# Patient Record
Sex: Male | Born: 1975 | Race: White | Hispanic: No | Marital: Married | Smoking: Never smoker
Health system: Southern US, Community
[De-identification: ages and names within clinical notes are randomized; demographics above are authoritative.]

## PROBLEM LIST (undated history)

## (undated) DIAGNOSIS — S0990XA Unspecified injury of head, initial encounter: Secondary | ICD-10-CM

## (undated) HISTORY — PX: SHOULDER SURGERY: SHX246

## (undated) HISTORY — PX: TONSILLECTOMY: SUR1361

---

## 2001-08-13 ENCOUNTER — Emergency Department (HOSPITAL_COMMUNITY): Admission: EM | Admit: 2001-08-13 | Discharge: 2001-08-13 | Payer: Self-pay | Admitting: *Deleted

## 2011-07-03 ENCOUNTER — Emergency Department (HOSPITAL_COMMUNITY): Payer: Self-pay

## 2011-07-03 ENCOUNTER — Encounter: Payer: Self-pay | Admitting: Emergency Medicine

## 2011-07-03 ENCOUNTER — Emergency Department (HOSPITAL_COMMUNITY)
Admission: EM | Admit: 2011-07-03 | Discharge: 2011-07-03 | Disposition: A | Discharge status: Home / Self Care | Payer: Self-pay | Attending: Emergency Medicine | Admitting: Emergency Medicine

## 2011-07-03 DIAGNOSIS — Z888 Allergy status to other drugs, medicaments and biological substances status: Secondary | ICD-10-CM | POA: Insufficient documentation

## 2011-07-03 DIAGNOSIS — S022XXA Fracture of nasal bones, initial encounter for closed fracture: Secondary | ICD-10-CM | POA: Insufficient documentation

## 2011-07-03 DIAGNOSIS — Z880 Allergy status to penicillin: Secondary | ICD-10-CM | POA: Insufficient documentation

## 2011-07-03 DIAGNOSIS — IMO0002 Reserved for concepts with insufficient information to code with codable children: Secondary | ICD-10-CM | POA: Insufficient documentation

## 2011-07-03 DIAGNOSIS — F17200 Nicotine dependence, unspecified, uncomplicated: Secondary | ICD-10-CM | POA: Insufficient documentation

## 2011-07-03 DIAGNOSIS — Y92009 Unspecified place in unspecified non-institutional (private) residence as the place of occurrence of the external cause: Secondary | ICD-10-CM | POA: Insufficient documentation

## 2011-07-03 HISTORY — DX: Unspecified injury of head, initial encounter: S09.90XA

## 2011-07-03 MED ORDER — PROMETHAZINE HCL 25 MG PO TABS: 25.0000 mg | ORAL_TABLET | Freq: Four times a day (QID) | ORAL | Status: AC | PRN

## 2011-07-03 MED ORDER — SODIUM CHLORIDE 0.9 % IV SOLN
INTRAVENOUS | Status: DC
  Administered 2011-07-03: 20:00:00 via INTRAVENOUS

## 2011-07-03 MED ORDER — HYDROCODONE-ACETAMINOPHEN 5-325 MG PO TABS: 2.0000 | ORAL_TABLET | Freq: Four times a day (QID) | ORAL | Status: AC | PRN

## 2011-07-03 MED ORDER — KETOROLAC TROMETHAMINE 30 MG/ML IJ SOLN
30.0000 mg | Freq: Once | INTRAMUSCULAR | Status: AC
  Administered 2011-07-03: 30 mg via INTRAVENOUS
  Filled 2011-07-03: qty 1

## 2011-07-03 MED ORDER — METOCLOPRAMIDE HCL 5 MG/ML IJ SOLN
10.0000 mg | Freq: Once | INTRAMUSCULAR | Status: AC
  Administered 2011-07-03: 10 mg via INTRAVENOUS
  Filled 2011-07-03: qty 2
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
## 2012-12-30 IMAGING — CT CT MAXILLOFACIAL W/O CM
3 of 4 series · 16 of 47 positions shown, 19 images · non-contrast
Comparison: None.

CT HEAD

CLINICAL DATA: Facial trauma, epistaxis, headache

CT HEAD WITHOUT CONTRAST
CT MAXILLOFACIAL WITHOUT CONTRAST
TECHNIQUE: Multidetector CT imaging of the head and maxillofacial
structures were performed using the standard protocol without
intravenous contrast. Multiplanar CT image reconstructions of the
maxillofacial structures were also generated.

[Series 4: max st 2.0 h31s · axial · 0.34mm/px · z∈[+67,+215]mm · 10 of 84 slices shown, 13 images]
[im 5/84  brain]
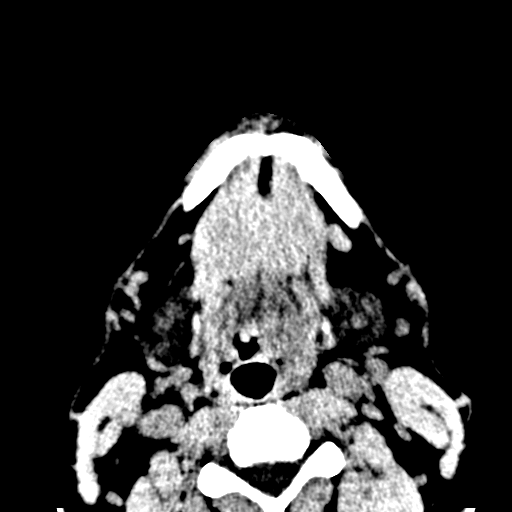
[im 5/84  bone]
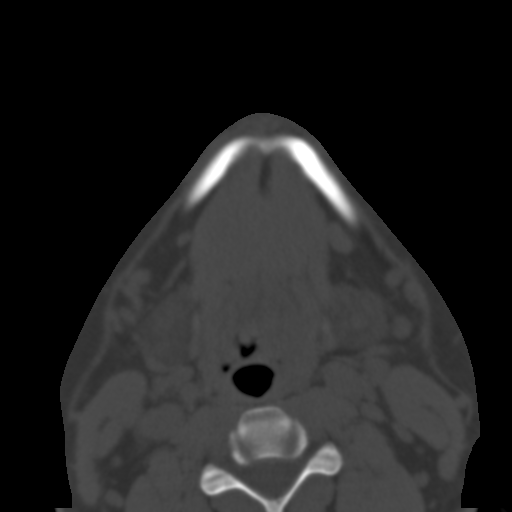
[im 13/84  bone]
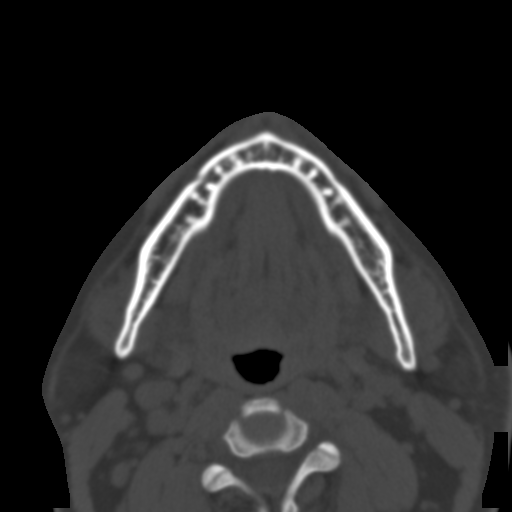
[im 21/84  bone]
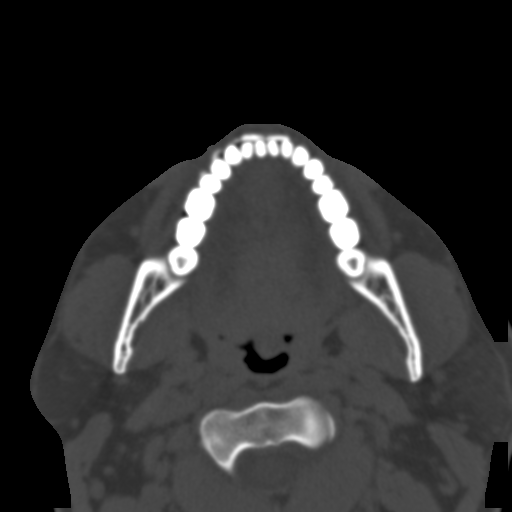
[im 30/84  bone]
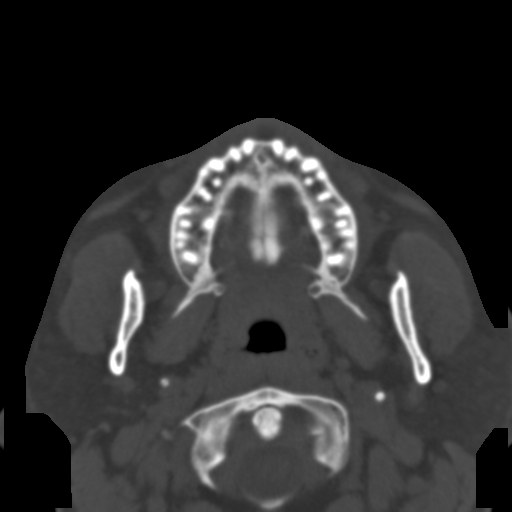
[im 38/84  brain]
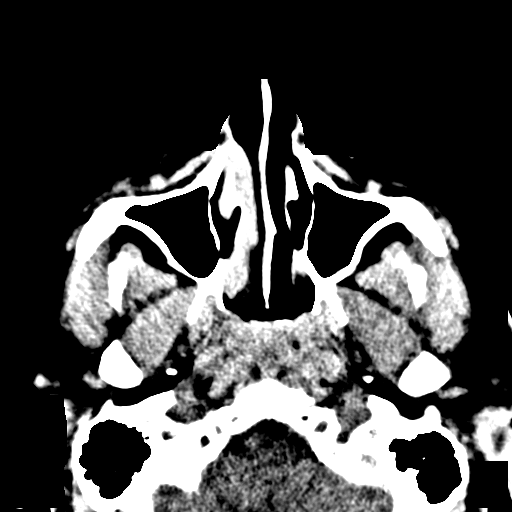
[im 38/84  bone]
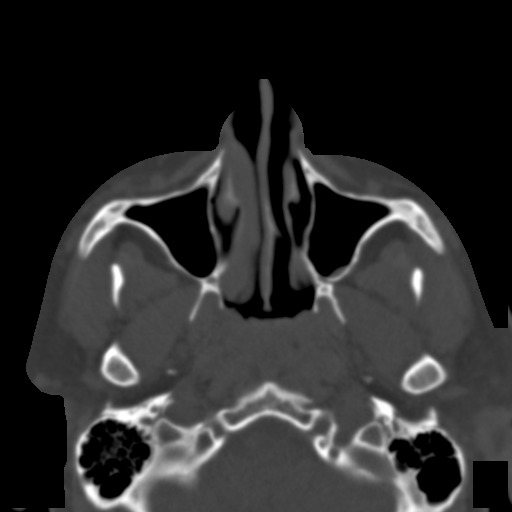
[im 46/84  bone]
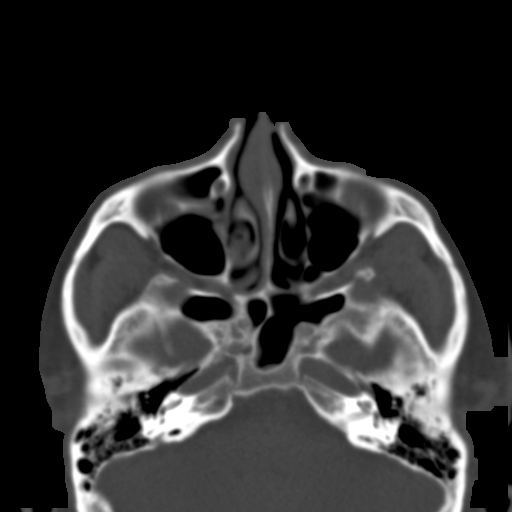
[im 54/84  bone]
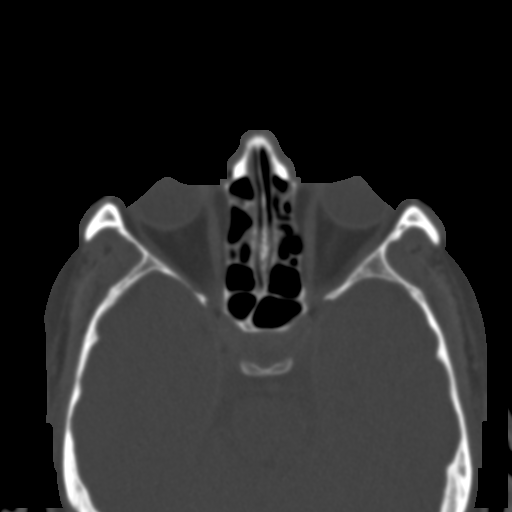
[im 63/84  bone]
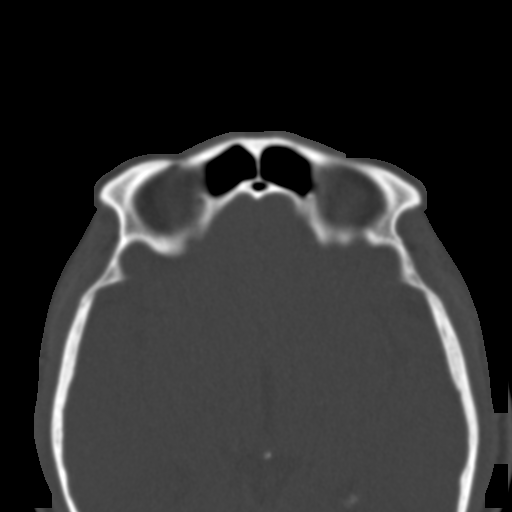
[im 71/84  brain]
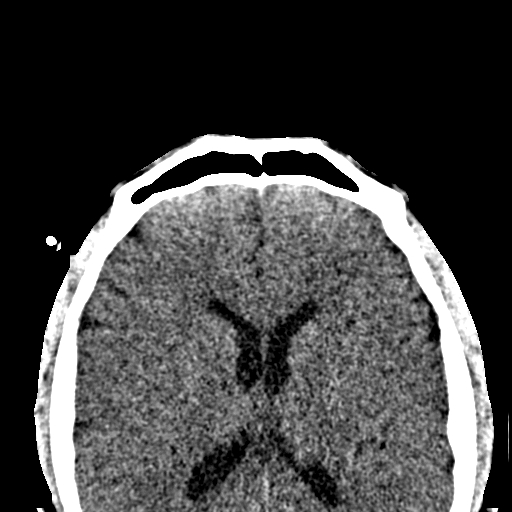
[im 71/84  bone]
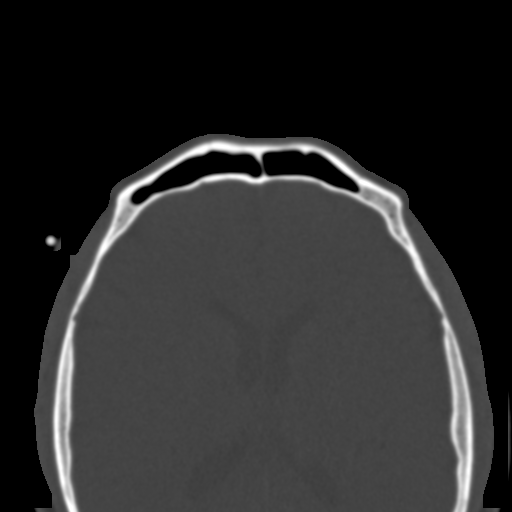
[im 79/84  bone]
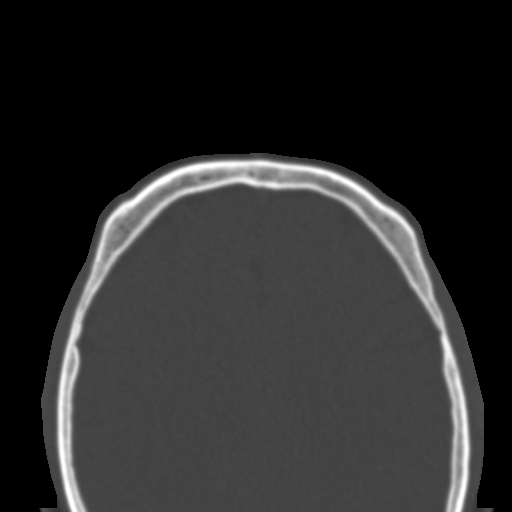

[Series 6: max st coronal · coronal · 0.31mm/px · 3 of 73 slices shown]
[im 25/73  bone]
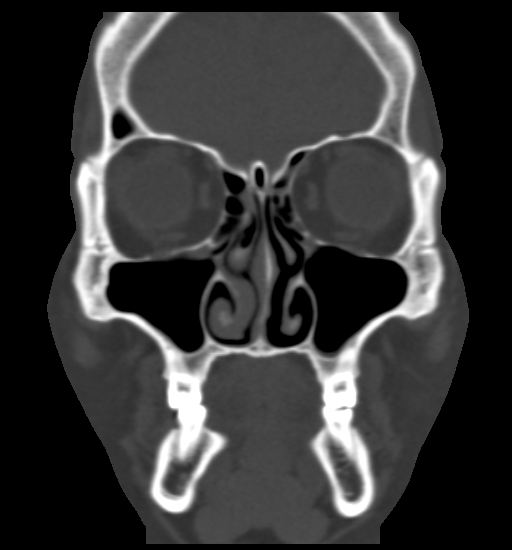
[im 33/73  bone]
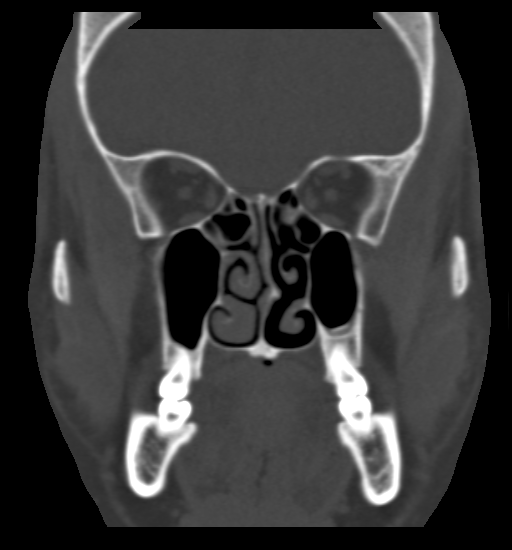
[im 41/73  bone]
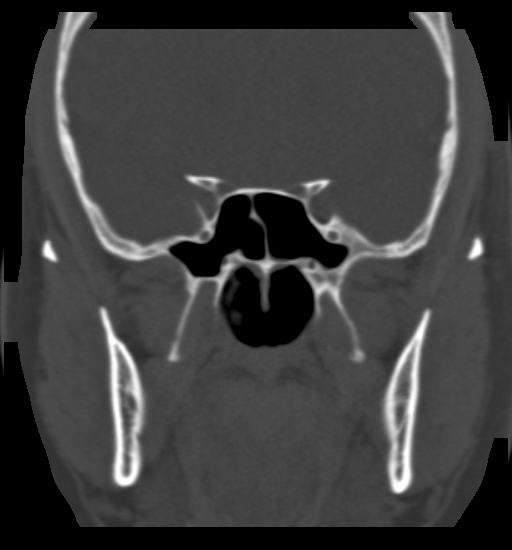

[Series 7: max st sag · sagittal · 0.29mm/px · 3 of 81 slices shown]
[im 27/81  bone]
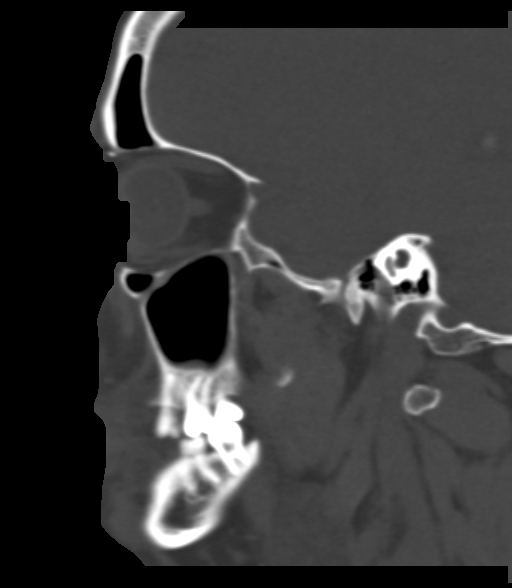
[im 41/81  bone]
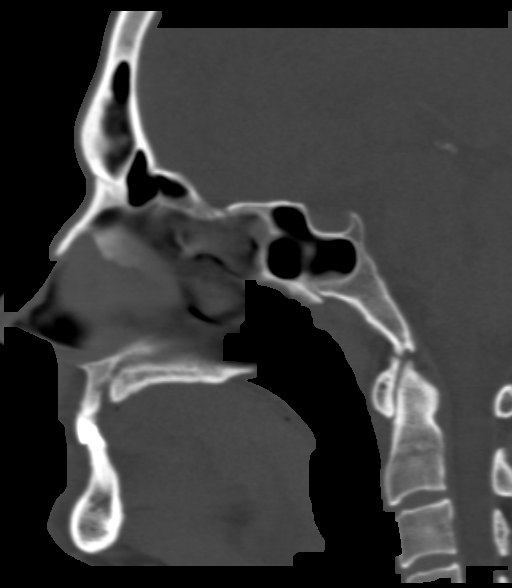
[im 54/81  bone]
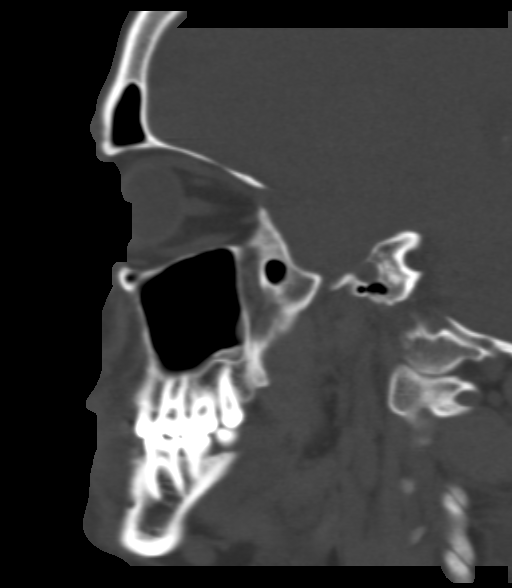

[16 of 47 positions shown; findings below may reference images not displayed]

FINDINGS: No acute hemorrhage, acute infarction, or mass lesion is
identified.  No midline shift.  No ventriculomegaly.  No skull
fracture.  Bone detail will be described in dedicated report below.
IMPRESSION: No acute intracranial finding.  Ethmoid sinusitis.

CT MAXILLOFACIAL
FINDINGS: Mandibular condyles are properly located.  Ethmoid and
maxillary mucoperiosteal thickening noted.  Nondisplaced nasal bone
fracture noted with overlying soft tissue swelling.  The vomer is
midline.  Orbits are unremarkable.  Zygomatic arches are
unremarkable.
IMPRESSION: Nondisplaced nasal bone fracture.

Ethmoid and maxillary sinusitis.

## 2018-07-22 ENCOUNTER — Ambulatory Visit: Payer: Managed Care, Other (non HMO) | Admitting: Sports Medicine

## 2018-07-22 ENCOUNTER — Ambulatory Visit: Payer: Managed Care, Other (non HMO) | Admitting: Podiatry

## 2018-07-22 ENCOUNTER — Encounter: Payer: Self-pay | Admitting: Sports Medicine

## 2018-07-22 ENCOUNTER — Ambulatory Visit (INDEPENDENT_AMBULATORY_CARE_PROVIDER_SITE_OTHER): Payer: Managed Care, Other (non HMO)

## 2018-07-22 DIAGNOSIS — M79672 Pain in left foot: Secondary | ICD-10-CM

## 2018-07-22 DIAGNOSIS — M722 Plantar fascial fibromatosis: Secondary | ICD-10-CM

## 2018-07-22 DIAGNOSIS — M7662 Achilles tendinitis, left leg: Secondary | ICD-10-CM

## 2018-07-22 MED ORDER — TRIAMCINOLONE ACETONIDE 10 MG/ML IJ SUSP: 10.0000 mg | Freq: Once | INTRAMUSCULAR | Status: AC

## 2018-07-22 MED ORDER — METHYLPREDNISOLONE 4 MG PO TBPK: ORAL_TABLET | ORAL | 0 refills | Status: AC

## 2018-07-22 NOTE — Progress Notes (Signed)
Subjective: Donald HumphreyBilly Clarke is a 42 y.o. male patient presents to office with complaint of moderate heel pain on the left plantar greater than posterior heel.  Patient admits to post static dyskinesia for 2 months and reports that this could have started after they started increasing his hours at work and he started doing a lot of overtime.  Patient states that pain is 6 out of 10 throbbing in nature however it slowly progresses throughout the day and by the end of the day the pain is 9 out of 10.  Patient states that the pain is in the arch and also the heel and feels like is radiating into the foot or back ankle.  Patient reports that he works for [employer] and does a lot of walking and standing and states that he gets sharp pain in his feet has tried Epson salt soaking and icing with no relief. Denies any other pedal complaints.   Patient reports that his doctor wrote him out of work until 07/24/2018 due to the severe foot pain and currently is taking tramadol.  There are no active problems to display for this patient.   Current Outpatient Medications on File Prior to Visit  Medication Sig Dispense Refill  . traMADol (ULTRAM) 50 MG tablet Take by mouth every 6 (six) hours as needed.     No current facility-administered medications on file prior to visit.     Allergies  Allergen Reactions  . Aspirin   . Codeine   . Penicillins     Objective: Physical Exam General: The patient is alert and oriented x3 in no acute distress.  Dermatology: Skin is warm, dry and supple bilateral lower extremities. Nails 1-10 are normal. There is no erythema, edema, no eccymosis, no open lesions present.  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  Integument is otherwise unremarkable.  Vascular: Dorsalis Pedis pulse and Posterior Tibial pulse are 2/4 bilateral. Capillary fill time is immediate to all digits.  Neurological:  Grossly intact to light touch with an achilles reflex of +2/5 and a  negative Tinel's sign bilateral.  Musculoskeletal: Tenderness to palpation at the medial calcaneal tubercale and through the insertion of the plantar fascia on the left foot.  There is pain with palpation to the insertion of the Achilles tendon diffusely on the left with subjective tightness, no pain with compression of calcaneus bilateral. No pain with tuning fork to calcaneus bilateral. No pain with calf compression bilateral. There is decreased Ankle joint range of motion bilateral. All other joints range of motion within normal limits bilateral. Strength 5/5 in all groups bilateral.   Gait: Unassisted, Antalgic avoid weight on left heel  Xray, Left foot:  Normal osseous mineralization. Joint spaces preserved. No fracture/dislocation/boney destruction. Calcaneal spur present with mild thickening of plantar fascia. No other soft tissue abnormalities or radiopaque foreign bodies.   Assessment and Plan: Problem List Items Addressed This Visit    None    Visit Diagnoses    Plantar fasciitis of left foot    -  Primary   Relevant Medications   triamcinolone acetonide (KENALOG) 10 MG/ML injection 10 mg   methylPREDNISolone (MEDROL DOSEPAK) 4 MG TBPK tablet   Other Relevant Orders   DG Foot Complete Left (Completed)   Tendonitis, Achilles, left       Relevant Medications   triamcinolone acetonide (KENALOG) 10 MG/ML injection 10 mg   methylPREDNISolone (MEDROL DOSEPAK)  4 MG TBPK tablet   Pain of left heel          -Complete examination performed.  -Xrays reviewed -Discussed with patient in detail the condition of plantar fasciitis with Achilles tendinitis, how this occurs and general treatment options. Explained both conservative and surgical treatments.  -After oral consent and aseptic prep, injected a mixture containing 1 ml of 2%  plain lidocaine, 1 ml 0.5% plain marcaine, 0.5 ml of kenalog 10 and 0.5 ml of  dexamethasone phosphate into left heel at plantar fascial insertion. Post-injection care discussed with patient.  -Rx Medrol dose pack and patient to continue with tramadol as given by PCP -Recommended good supportive shoes and advised use of OTC insert.  Dispensed heel cushion and advised patient to continue with no work if fails to improve patient may benefit from a cam boot. -Explained and dispensed to patient daily stretching exercises. -Recommend patient to ice affected area 1-2x daily. -Patient to return to office in 2 weeks for follow up or sooner if problems or questions arise.  Asencion Islam, DPM

## 2018-07-22 NOTE — Patient Instructions (Signed)

## 2018-07-30 ENCOUNTER — Ambulatory Visit: Payer: Managed Care, Other (non HMO) | Admitting: Sports Medicine

## 2018-08-06 ENCOUNTER — Telehealth: Payer: Self-pay | Admitting: *Deleted

## 2018-08-06 ENCOUNTER — Encounter: Payer: Self-pay | Admitting: Sports Medicine

## 2018-08-06 ENCOUNTER — Ambulatory Visit: Payer: Managed Care, Other (non HMO) | Admitting: Sports Medicine

## 2018-08-06 DIAGNOSIS — M7662 Achilles tendinitis, left leg: Secondary | ICD-10-CM | POA: Diagnosis not present

## 2018-08-06 DIAGNOSIS — S96912A Strain of unspecified muscle and tendon at ankle and foot level, left foot, initial encounter: Secondary | ICD-10-CM | POA: Diagnosis not present

## 2018-08-06 DIAGNOSIS — M79672 Pain in left foot: Secondary | ICD-10-CM | POA: Diagnosis not present

## 2018-08-06 DIAGNOSIS — M722 Plantar fascial fibromatosis: Secondary | ICD-10-CM

## 2018-08-06 MED ORDER — MELOXICAM 15 MG PO TABS: 15.0000 mg | ORAL_TABLET | Freq: Every day | ORAL | 0 refills | Status: DC

## 2018-08-06 NOTE — Progress Notes (Signed)
Subjective: Donald Clarke is a 42 y.o. male patient returns office for follow-up evaluation of moderate to severe left heel pain states that the pain today is worse at the posterior side especially at the heel bone and it radiates to the plantar surface of the heel out into the arch states that the boot helps with walking however as soon as he takes the boot off there is severe throbbing through the back of the heel and arch with continued swelling states that he completed the steroid by mouth which helped to get some of the swelling down however the pain is still there states that he has been taking tramadol to also help of which his primary care doctor gave him as well and states that when he went to see his primary care doctor on last week she also gave him the cam boot however his pain has still not resolved and he has still been unable to return to work due to the fact that he cannot wear boot at work and due to the fact that he has still significant pain requiring pain medication.  Patient denies nausea, vomiting, fever, chills or any other constitutional symptoms at this time.  There are no active problems to display for this patient.   Current Outpatient Medications on File Prior to Visit  Medication Sig Dispense Refill  . methylPREDNISolone (MEDROL DOSEPAK) 4 MG TBPK tablet Take as directed 21 tablet 0  . naproxen (NAPROSYN) 500 MG tablet Take 500 mg by mouth 2 (two) times daily with a meal.  0  . predniSONE (DELTASONE) 50 MG tablet     . traMADol (ULTRAM) 50 MG tablet Take by mouth every 6 (six) hours as needed.     Current Facility-Administered Medications on File Prior to Visit  Medication Dose Route Frequency Provider Last Rate Last Dose  . triamcinolone acetonide (KENALOG) 10 MG/ML injection 10 mg  10 mg Other Once Asencion Islam, DPM        Allergies  Allergen Reactions  . Aspirin   . Codeine   . Penicillins     Objective: Physical Exam General: The patient is alert and  oriented x3 in no acute distress.  Dermatology: Skin is warm, dry and supple bilateral lower extremities. Nails 1-10 are normal. There is no erythema, edema, no eccymosis, no open lesions present.  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  Integument is otherwise unremarkable.  Vascular: Dorsalis Pedis pulse and Posterior Tibial pulse are 2/4 bilateral. Capillary fill time is immediate to all digits.  Neurological: Grossly intact to light touch with an achilles reflex of +2/5 and a  negative Tinel's sign bilateral.  Musculoskeletal: Moderate tenderness to palpation at the medial calcaneal tubercale and through the insertion of the plantar fascia on the left foot.  There is moderate pain with palpation to the insertion of the Achilles tendon diffusely on the left with subjective tightness and weakness, no pain with compression of calcaneus bilateral. No pain with tuning fork to calcaneus bilateral. No pain with calf compression bilateral. There is decreased Ankle joint range of motion bilateral. All other joints range of motion within normal limits bilateral. Strength 5/5 in all groups bilateral however there is guarding today due to pain and negative Thompson sign.  Gait: Cam boot assisted, Antalgic avoid weight on left heel.   Assessment and Plan: Problem List Items Addressed This Visit    None  Visit Diagnoses    Plantar fasciitis of left foot    -  Primary   Relevant Medications   meloxicam (MOBIC) 15 MG tablet   Other Relevant Orders   DME Crutches   Tendonitis, Achilles, left       Relevant Medications   meloxicam (MOBIC) 15 MG tablet   Other Relevant Orders   DME Crutches   Pain of left heel       Tear of tendon of left ankle, initial encounter          -Complete examination performed.  -Previous xrays reviewed -Discussed with patient in detail the condition of plantar fasciitis with  Achilles tendinitis with concern for possible partial tear since pain is worsening, how this occurs and general treatment options. Explained both conservative and surgical treatments.  -Dispensed crutches and applied a soft cast to the patient's left foot and advised nonweightbearing to see if this will assist with pain reduction -Rx meloxicam and patient to continue with tramadol as given by PCP -Recommended continue with no work at this time until MRI has been obtained to rule out partial tear -Recommend patient to ice affected area 1-2x daily. -Patient to return to office after MRI or sooner if problems or questions arise.  Asencion Islam, DPM

## 2018-08-06 NOTE — Telephone Encounter (Signed)
-----   Message from [place], [place] sent at 08/06/2018  2:55 PM EDT ----- Regarding: MRI Left heel Pain at achilles and plantar fascia thats severe and no improvement

## 2018-08-07 ENCOUNTER — Encounter: Payer: Self-pay | Admitting: Sports Medicine

## 2018-08-07 NOTE — Telephone Encounter (Signed)
Faxed orders, 08/06/2018 clinical, demographcs to [facility].

## 2018-08-09 ENCOUNTER — Telehealth: Payer: Self-pay | Admitting: Sports Medicine

## 2018-08-09 NOTE — Telephone Encounter (Signed)
Pt did not call at 09:12am. I called Crystal - [facility] and informed that our office had been informed [facility] performed the pre-cert on [employer]. Crystal states they do [employer] but need additional information. Faxed the previously faxed required form, LOV clinicals and 07/22/2018 clinicals to Crystal's fax.

## 2018-08-09 NOTE — Telephone Encounter (Signed)
Pt is scheduled for MRI of left ankle on Monday 10/7, [employer] is requiring a prior authorization. Please call  [phone] and speak with  Crystal @ [facility].

## 2018-08-12 ENCOUNTER — Other Ambulatory Visit: Payer: Self-pay | Admitting: Sports Medicine

## 2018-08-13 NOTE — Telephone Encounter (Signed)
Pt states he called [facility] and they stated they do not have the orders for his MRI. I told pt his MRI had been ordered at [facility] and I would call to check the status.

## 2018-08-13 NOTE — Telephone Encounter (Signed)
I spoke with Shawna Orleans - [facility] states their records showed he was a no show. I told Shawna Orleans he never received a call. I told Shawna Orleans I had received a call from Crystal - [facility] and she had requested more clinicals and I had faxed those on 08/09/2018, I asked to speak with Crystal. Crystal states MRI required a PEER TO PEER. I asked if the contacted the ordering doctor to inform of the PEER TO PEER and she stated she had. I did not receive another call from [facility] after the request for clinicals on 08/09/2018. I gave the MRI and Clinicals to Tomasa Hose, RN for follow up on pre-cert to include possible PEER TO PEER.

## 2018-08-13 NOTE — Telephone Encounter (Signed)
Left message informing pt our pre-cert coordinator would process and I would call again with instructions.

## 2018-08-15 ENCOUNTER — Telehealth: Payer: Self-pay | Admitting: Sports Medicine

## 2018-08-15 DIAGNOSIS — S96912A Strain of unspecified muscle and tendon at ankle and foot level, left foot, initial encounter: Secondary | ICD-10-CM

## 2018-08-15 DIAGNOSIS — M722 Plantar fascial fibromatosis: Secondary | ICD-10-CM

## 2018-08-15 DIAGNOSIS — M7662 Achilles tendinitis, left leg: Secondary | ICD-10-CM

## 2018-08-15 DIAGNOSIS — M79672 Pain in left foot: Secondary | ICD-10-CM

## 2018-08-15 NOTE — Addendum Note (Signed)
Addended by: Alphia Kava D on: 08/15/2018 03:43 PM   Modules accepted: Orders

## 2018-08-15 NOTE — Telephone Encounter (Signed)
This is Engineer, production from [employer]. I'm calling to say the MRI Dr. Marylene Land requested will be approved by the end of the business day so you can proceed with scheduling. You can call us with any questions at [phone].

## 2018-08-15 NOTE — Telephone Encounter (Signed)
Left message informing pt our office had received prior authorization for the MRI of left ankle and to schedule with [facility] [phone].

## 2018-08-19 ENCOUNTER — Telehealth: Payer: Self-pay | Admitting: Sports Medicine

## 2018-08-19 NOTE — Telephone Encounter (Signed)
This is Tia calling from [facility]. We need to know Mr. Dougher next appointment so we can extend his benefits. Our number is [phone].

## 2018-08-19 NOTE — Telephone Encounter (Signed)
Called and spoke with Lawanna Kobus at [facility]. Told him when the pt's next appointment was as pt is still out of work. Lawanna Kobus stated they would contact our office if they needed anything else.

## 2018-08-20 ENCOUNTER — Telehealth: Payer: Self-pay | Admitting: Sports Medicine

## 2018-08-20 DIAGNOSIS — S96912A Strain of unspecified muscle and tendon at ankle and foot level, left foot, initial encounter: Secondary | ICD-10-CM

## 2018-08-20 DIAGNOSIS — M7662 Achilles tendinitis, left leg: Secondary | ICD-10-CM

## 2018-08-20 DIAGNOSIS — M722 Plantar fascial fibromatosis: Secondary | ICD-10-CM

## 2018-08-20 DIAGNOSIS — M79672 Pain in left foot: Secondary | ICD-10-CM

## 2018-08-20 NOTE — Telephone Encounter (Signed)
Patient received a call from [name] and was told he needed to pay 400.00 for his MRI. He cant afford that right now, they also told him, he could pay 200 That's still out of reach right now. Mr. Donald Clarke just not sure what to do.

## 2018-08-22 NOTE — Telephone Encounter (Signed)
Since his suspected pain is along soft tissues like fascia and achilles we can order a MSK ultrasound since that will be less costly than MRI and still help me r/o tear -Dr. Marylene Land

## 2018-08-22 NOTE — Telephone Encounter (Signed)
Left message informing pt, that his orders for the MRI had been sent to [facility], but if he had not called [facility] but had called [facility] we could send orders to [facility] they may be less expensive, to call so I could discuss.

## 2018-08-22 NOTE — Telephone Encounter (Signed)
Pt states Stacy - [facility] states pt's MRI would need to be performed at [facility] and [facility] states he would owe $400.00. I told pt I would inform Dr. Marylene Land and for more information and call again. I transferred pt to [facility] [place] [employer] to discuss disablity.

## 2018-08-23 NOTE — Addendum Note (Signed)
Addended by: Alphia Kava D on: 08/23/2018 09:46 AM   Modules accepted: Orders

## 2018-08-23 NOTE — Telephone Encounter (Signed)
I informed pt of [facility] statement.

## 2018-08-23 NOTE — Telephone Encounter (Signed)
Pt called states [facility] states they do not do [place] on feet.

## 2018-08-23 NOTE — Telephone Encounter (Signed)
I left message on pt's mobile phone with [facility] [phone].

## 2018-08-23 NOTE — Telephone Encounter (Signed)
[facility] [employer] states they can do it but they have to have radiologist perform and will schedule with radiologist secretary and contact pt.

## 2018-08-23 NOTE — Telephone Encounter (Signed)
I informed pt of Dr. Wynema Birch [place] order and pt states he would like to have in Cash at [facility]. Orders faxed to [facility].

## 2018-08-26 ENCOUNTER — Ambulatory Visit
Admission: RE | Admit: 2018-08-26 | Discharge: 2018-08-26 | Disposition: A | Discharge status: Home / Self Care | Payer: Managed Care, Other (non HMO) | Source: Ambulatory Visit | Attending: Sports Medicine | Admitting: Sports Medicine

## 2018-08-26 DIAGNOSIS — S96912A Strain of unspecified muscle and tendon at ankle and foot level, left foot, initial encounter: Secondary | ICD-10-CM

## 2018-08-26 DIAGNOSIS — M722 Plantar fascial fibromatosis: Secondary | ICD-10-CM

## 2018-08-26 DIAGNOSIS — M79672 Pain in left foot: Secondary | ICD-10-CM

## 2018-08-26 DIAGNOSIS — M7662 Achilles tendinitis, left leg: Secondary | ICD-10-CM

## 2018-08-27 ENCOUNTER — Ambulatory Visit (INDEPENDENT_AMBULATORY_CARE_PROVIDER_SITE_OTHER): Payer: Managed Care, Other (non HMO) | Admitting: Sports Medicine

## 2018-08-27 ENCOUNTER — Encounter: Payer: Self-pay | Admitting: Sports Medicine

## 2018-08-27 DIAGNOSIS — M79672 Pain in left foot: Secondary | ICD-10-CM

## 2018-08-27 DIAGNOSIS — M7662 Achilles tendinitis, left leg: Secondary | ICD-10-CM

## 2018-08-27 DIAGNOSIS — M722 Plantar fascial fibromatosis: Secondary | ICD-10-CM

## 2018-08-27 NOTE — Progress Notes (Signed)
Subjective: Donald Clarke is a 42 y.o. male patient returns office for follow-up evaluation of moderate to severe left heel pain states that the pain today is a little better and that he has been taking tramadol to also help of which his primary care doctor gave him. Patient is here for [place] results.  Patient denies nausea, vomiting, fever, chills or any other constitutional symptoms at this time.  There are no active problems to display for this patient.   Current Outpatient Medications on File Prior to Visit  Medication Sig Dispense Refill  . meloxicam (MOBIC) 15 MG tablet Take 1 tablet (15 mg total) by mouth daily. 30 tablet 0  . methylPREDNISolone (MEDROL DOSEPAK) 4 MG TBPK tablet Take as directed 21 tablet 0  . naproxen (NAPROSYN) 500 MG tablet Take 500 mg by mouth 2 (two) times daily with a meal.  0  . predniSONE (DELTASONE) 50 MG tablet     . traMADol (ULTRAM) 50 MG tablet Take by mouth every 6 (six) hours as needed.     Current Facility-Administered Medications on File Prior to Visit  Medication Dose Route Frequency Provider Last Rate Last Dose  . triamcinolone acetonide (KENALOG) 10 MG/ML injection 10 mg  10 mg Other Once Asencion Islam, DPM        Allergies  Allergen Reactions  . Aspirin   . Codeine   . Penicillins     Objective: Physical Exam General: The patient is alert and oriented x3 in no acute distress.  Dermatology: Skin is warm, dry and supple bilateral lower extremities. Nails 1-10 are normal. There is no erythema, edema, no eccymosis, no open lesions present.  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  Integument is otherwise unremarkable.  Vascular: Dorsalis Pedis pulse and Posterior Tibial pulse are 2/4 bilateral. Capillary fill time is immediate to all digits.  Neurological: Grossly intact to light touch with an achilles reflex of +2/5 and a  negative Tinel's sign  bilateral.  Musculoskeletal: Moderate tenderness to palpation at the medial calcaneal tubercale and through the insertion of the plantar fascia on the left foot.  There is moderate pain with palpation to the insertion of the Achilles tendon diffusely on the left with subjective tightness and weakness, no pain with compression of calcaneus bilateral. No pain with tuning fork to calcaneus bilateral. No pain with calf compression bilateral. There is decreased Ankle joint range of motion bilateral. All other joints range of motion within normal limits bilateral. Strength 5/5 in all groups bilateral however there is guarding today due to pain and negative Thompson sign.  Gait: Cam boot assisted, Antalgic avoid weight on left heel.   Assessment and Plan: Problem List Items Addressed This Visit    None    Visit Diagnoses    Plantar fasciitis of left foot    -  Primary   Tendonitis, Achilles, left       Pain of left heel          -Complete examination performed.  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 Consent obtained for L EPF and Gastroc recession. Pre and Post op course explained. Risks, benefits, alternatives explained. No guarantees given or implied. Surgical booking slip submitted and provided patient with Surgical packet and info for [facility] -Patient has CAM Walker to use post op  with crutches for at least 1 month -No work until after surgery; expect 3 month post op recovery  -Patient to return to office after surgery or sooner if problems or questions arise.  Asencion Islam, DPM

## 2018-08-27 NOTE — Patient Instructions (Signed)
Pre-Operative Instructions  Congratulations, you have decided to take an important step towards improving your quality of life.  You can be assured that the doctors and staff at [facility] will be with you every step of the way.  Here are some important things you should know:  1. Plan to be at the surgery center/hospital at least 1 (one) hour prior to your scheduled time, unless otherwise directed by the surgical center/hospital staff.  You must have a responsible adult accompany you, remain during the surgery and drive you home.  Make sure you have directions to the surgical center/hospital to ensure you arrive on time. 2. If you are having surgery at [facility] or [facility] hospitals, you will need a copy of your medical history and physical form from your family physician within one month prior to the date of surgery. We will give you a form for your primary physician to complete.  3. We make every effort to accommodate the date you request for surgery.  However, there are times where surgery dates or times have to be moved.  We will contact you as soon as possible if a change in schedule is required.   4. No aspirin/ibuprofen for one week before surgery.  If you are on aspirin, any non-steroidal anti-inflammatory medications (Mobic, Aleve, Ibuprofen) should not be taken seven (7) days prior to your surgery.  You make take Tylenol for pain prior to surgery.  5. Medications - If you are taking daily heart and blood pressure medications, seizure, reflux, allergy, asthma, anxiety, pain or diabetes medications, make sure you notify the surgery center/hospital before the day of surgery so they can tell you which medications you should take or avoid the day of surgery. 6. No food or drink after midnight the night before surgery unless directed otherwise by surgical center/hospital staff. 7. No alcoholic beverages 24-hours prior to surgery.  No smoking 24-hours prior or 24-hours after  surgery. 8. Wear loose pants or shorts. They should be loose enough to fit over bandages, boots, and casts. 9. Don't wear slip-on shoes. Sneakers are preferred. 10. Bring your boot with you to the surgery center/hospital.  Also bring crutches or a walker if your physician has prescribed it for you.  If you do not have this equipment, it will be provided for you after surgery. 11. If you have not been contacted by the surgery center/hospital by the day before your surgery, call to confirm the date and time of your surgery. 12. Leave-time from work may vary depending on the type of surgery you have.  Appropriate arrangements should be made prior to surgery with your employer. 13. Prescriptions will be provided immediately following surgery by your doctor.  Fill these as soon as possible after surgery and take the medication as directed. Pain medications will not be refilled on weekends and must be approved by the doctor. 14. Remove nail polish on the operative foot and avoid getting pedicures prior to surgery. 15. Wash the night before surgery.  The night before surgery wash the foot and leg well with water and the antibacterial soap provided. Be sure to pay special attention to beneath the toenails and in between the toes.  Wash for at least three (3) minutes. Rinse thoroughly with water and dry well with a towel.  Perform this wash unless told not to do so by your physician.  Enclosed: 1 Ice pack (please put in freezer the night before surgery)   1 Hibiclens skin cleaner     Pre-op instructions  If you have any questions regarding the instructions, please do not hesitate to call our office.  [place]: [address], [place], [place] [address] -- [phone]  [place]: [address] [place], [place] [address] -- [phone]  [place]: [address]  [place],  [address] -- [phone]  [place]: [address], [address], [place],  [address] -- [phone]  Website:  [email or web address] 

## 2018-08-28 ENCOUNTER — Encounter: Payer: Self-pay | Admitting: Sports Medicine

## 2018-08-28 ENCOUNTER — Telehealth: Payer: Self-pay | Admitting: *Deleted

## 2018-08-28 ENCOUNTER — Other Ambulatory Visit: Payer: Self-pay | Admitting: Sports Medicine

## 2018-08-28 DIAGNOSIS — M7662 Achilles tendinitis, left leg: Secondary | ICD-10-CM

## 2018-08-28 DIAGNOSIS — M722 Plantar fascial fibromatosis: Secondary | ICD-10-CM

## 2018-08-28 DIAGNOSIS — M79672 Pain in left foot: Secondary | ICD-10-CM

## 2018-08-28 DIAGNOSIS — S96912A Strain of unspecified muscle and tendon at ankle and foot level, left foot, initial encounter: Secondary | ICD-10-CM

## 2018-08-28 NOTE — Telephone Encounter (Signed)
"  I'd like to schedule my surgery.  I saw Dr. Marylene Land yesterday."  Do you have a date in mind?  Dr. Marylene Land can do it on November 4.  "That date will be fine.  What time do I need to be there?"  Someone from the surgical center will call you the Friday before your surgery date and they will give you your arrival time.  You need to register with the surgical center online, the instructions are in the brochure that we gave you.  "Okay, I'll do that. Thank you."

## 2018-09-02 ENCOUNTER — Telehealth: Payer: Self-pay | Admitting: *Deleted

## 2018-09-02 NOTE — Telephone Encounter (Signed)
"  I am calling from [facility].  I was informed by the patient that he's scheduled to have surgery on November 4.  I need to confirm that this is true so I can initiate his request for leave."   Yes, he's scheduled for surgery on November 4.  (We had a poor connection and I could not obtain further details.)

## 2018-09-04 ENCOUNTER — Other Ambulatory Visit: Payer: Self-pay | Admitting: Sports Medicine

## 2018-09-04 DIAGNOSIS — M7662 Achilles tendinitis, left leg: Secondary | ICD-10-CM

## 2018-09-04 DIAGNOSIS — M722 Plantar fascial fibromatosis: Secondary | ICD-10-CM

## 2018-09-04 NOTE — Telephone Encounter (Signed)
Yes you may refill and make sure patient knows to stop taking Mobic at least 1 week prior to surgery Thanks Dr. Marylene Land

## 2018-09-09 ENCOUNTER — Encounter: Payer: Self-pay | Admitting: Sports Medicine

## 2018-09-09 ENCOUNTER — Other Ambulatory Visit: Payer: Self-pay | Admitting: Sports Medicine

## 2018-09-09 DIAGNOSIS — M7662 Achilles tendinitis, left leg: Secondary | ICD-10-CM

## 2018-09-09 DIAGNOSIS — M722 Plantar fascial fibromatosis: Secondary | ICD-10-CM

## 2018-09-09 MED ORDER — PROMETHAZINE HCL 25 MG PO TABS: 25.0000 mg | ORAL_TABLET | Freq: Three times a day (TID) | ORAL | 0 refills | Status: AC | PRN

## 2018-09-09 MED ORDER — DOCUSATE SODIUM 100 MG PO CAPS: 100.0000 mg | ORAL_CAPSULE | Freq: Two times a day (BID) | ORAL | 0 refills | Status: AC | PRN

## 2018-09-09 MED ORDER — HYDROMORPHONE HCL 4 MG PO TABS: 4.0000 mg | ORAL_TABLET | Freq: Four times a day (QID) | ORAL | 0 refills | Status: AC | PRN

## 2018-09-09 NOTE — Progress Notes (Signed)
Post op meds added to chart -Dr. Jaide Hillenburg 

## 2018-09-10 ENCOUNTER — Telehealth: Payer: Self-pay | Admitting: Sports Medicine

## 2018-09-10 NOTE — Telephone Encounter (Signed)
Post op check phone call made to patient. Patient reports that he is doing good. His leg is still numb states that he did not take pain medication last night. Reports he has no pain. Went to his son's football game last night and reports that he did not have any issues. He used handicap, crutches and iced and elevated during the game. Patient this AM denies any acute symptoms. I reminded him to continue with ice, elevation, rest, crutches and CAM boot to protect his foot. Patient expressed understanding and is to follow up as scheduled on next week for post op visit #1. -Dr. Marylene Land

## 2018-09-17 ENCOUNTER — Ambulatory Visit (INDEPENDENT_AMBULATORY_CARE_PROVIDER_SITE_OTHER): Payer: Managed Care, Other (non HMO) | Admitting: Sports Medicine

## 2018-09-17 ENCOUNTER — Encounter: Payer: Self-pay | Admitting: Sports Medicine

## 2018-09-17 ENCOUNTER — Telehealth: Payer: Self-pay | Admitting: *Deleted

## 2018-09-17 VITALS — BP 104/84 | HR 125

## 2018-09-17 DIAGNOSIS — Z9889 Other specified postprocedural states: Secondary | ICD-10-CM

## 2018-09-17 DIAGNOSIS — S96912A Strain of unspecified muscle and tendon at ankle and foot level, left foot, initial encounter: Secondary | ICD-10-CM

## 2018-09-17 DIAGNOSIS — M7662 Achilles tendinitis, left leg: Secondary | ICD-10-CM

## 2018-09-17 DIAGNOSIS — M722 Plantar fascial fibromatosis: Secondary | ICD-10-CM

## 2018-09-17 DIAGNOSIS — M79672 Pain in left foot: Secondary | ICD-10-CM

## 2018-09-17 MED ORDER — HYDROCODONE-ACETAMINOPHEN 10-325 MG PO TABS: 1.0000 | ORAL_TABLET | Freq: Three times a day (TID) | ORAL | 0 refills | Status: AC | PRN

## 2018-09-17 NOTE — Telephone Encounter (Signed)
Faxed order to [facility] and emailed to A. Trotter. 

## 2018-09-17 NOTE — Telephone Encounter (Signed)
-----   Message from Asencion Islamitorya Stover, [place] sent at 09/17/2018 11:35 AM EST ----- Regarding: Knee scooter [facility]

## 2018-09-17 NOTE — Progress Notes (Signed)
Subjective: Donald HumphreyBilly Danford is a 42 y.o. male patient seen today in office for POV #1  (DOS 09-09-18), S/P L EPF and gastrocnemius recession. Patient admits pain at surgical site 6-9/10 cramping sometimes in calf, denies headache, chest pain, shortness of breath, nausea, vomiting, fever, or chills. Patient states that he doesn't like the crutches. No other issues noted.   There are no active problems to display for this patient.   Current Outpatient Medications on File Prior to Visit  Medication Sig Dispense Refill  . docusate sodium (COLACE) 100 MG capsule Take 1 capsule (100 mg total) by mouth 2 (two) times daily as needed for mild constipation. 30 capsule 0  . meloxicam (MOBIC) 15 MG tablet TAKE 1 TABLET BY MOUTH EVERY DAY 30 tablet 0  . methylPREDNISolone (MEDROL DOSEPAK) 4 MG TBPK tablet Take as directed 21 tablet 0  . naproxen (NAPROSYN) 500 MG tablet Take 500 mg by mouth 2 (two) times daily with a meal.  0  . predniSONE (DELTASONE) 50 MG tablet     . promethazine (PHENERGAN) 25 MG tablet Take 1 tablet (25 mg total) by mouth every 8 (eight) hours as needed for nausea or vomiting. 30 tablet 0  . traMADol (ULTRAM) 50 MG tablet Take by mouth every 6 (six) hours as needed.     Current Facility-Administered Medications on File Prior to Visit  Medication Dose Route Frequency Provider Last Rate Last Dose  . triamcinolone acetonide (KENALOG) 10 MG/ML injection 10 mg  10 mg Other Once Asencion IslamStover, Joshu Furukawa, DPM        Allergies  Allergen Reactions  . Aspirin   . Codeine   . Penicillins     Objective: There were no vitals filed for this visit.  General: No acute distress, AAOx3  Left foot: Sutures intact with no gapping or dehiscence at surgical site, mild swelling to left surgical sites on foot and calf, no erythema, no warmth, no drainage, no signs of infection noted, Capillary fill time <3 seconds in all digits, gross sensation present via light touch to left foot. No pain or crepitation with  range of motion left foot however mild guarding to calf.  No pain with calf compression.   Assessment and Plan:  Problem List Items Addressed This Visit    None    Visit Diagnoses    Plantar fasciitis of left foot    -  Primary   Tendonitis, Achilles, left       Pain of left heel       Post-operative state       Relevant Medications   HYDROcodone-acetaminophen (NORCO) 10-325 MG tablet   Other Relevant Orders   DME Other see comment       -Patient seen and evaluated -Applied dry sterile dressing to surgical site left secured with ACE wrap -Continue with crutches and NWB and CAM boot -Changed Dilaudid to Norco  -Advised patient to limit activity to necessity  -Advised patient to ice and elevate as necessary  -Will plan for suture removal at next office visit. In the meantime, patient to call office if any issues or problems arise.   Asencion Islamitorya Zenola Dezarn, DPM

## 2018-09-24 ENCOUNTER — Encounter: Payer: Managed Care, Other (non HMO) | Admitting: Sports Medicine

## 2018-10-01 ENCOUNTER — Encounter: Payer: Self-pay | Admitting: Sports Medicine

## 2018-10-01 ENCOUNTER — Ambulatory Visit (INDEPENDENT_AMBULATORY_CARE_PROVIDER_SITE_OTHER): Payer: Managed Care, Other (non HMO) | Admitting: Sports Medicine

## 2018-10-01 DIAGNOSIS — S96912D Strain of unspecified muscle and tendon at ankle and foot level, left foot, subsequent encounter: Secondary | ICD-10-CM

## 2018-10-01 DIAGNOSIS — Z9889 Other specified postprocedural states: Secondary | ICD-10-CM

## 2018-10-01 DIAGNOSIS — M7662 Achilles tendinitis, left leg: Secondary | ICD-10-CM

## 2018-10-01 DIAGNOSIS — M722 Plantar fascial fibromatosis: Secondary | ICD-10-CM

## 2018-10-01 MED ORDER — HYDROCODONE-ACETAMINOPHEN 10-325 MG PO TABS: 1.0000 | ORAL_TABLET | Freq: Three times a day (TID) | ORAL | 0 refills | Status: AC | PRN

## 2018-10-01 NOTE — Progress Notes (Signed)
Subjective: Donald Clarke is a 42 y.o. male patient seen today in office for POV #2  (DOS 09-09-18), S/P L EPF and gastrocnemius recession. Patient admits pain at surgical site off and on sharp but doing better, Norco helps, denies headache, chest pain, shortness of breath, nausea, vomiting, fever, or chills. No other issues noted.   There are no active problems to display for this patient.   Current Outpatient Medications on File Prior to Visit  Medication Sig Dispense Refill  . docusate sodium (COLACE) 100 MG capsule Take 1 capsule (100 mg total) by mouth 2 (two) times daily as needed for mild constipation. 30 capsule 0  . meloxicam (MOBIC) 15 MG tablet TAKE 1 TABLET BY MOUTH EVERY DAY 30 tablet 0  . methylPREDNISolone (MEDROL DOSEPAK) 4 MG TBPK tablet Take as directed 21 tablet 0  . naproxen (NAPROSYN) 500 MG tablet Take 500 mg by mouth 2 (two) times daily with a meal.  0  . predniSONE (DELTASONE) 50 MG tablet     . promethazine (PHENERGAN) 25 MG tablet Take 1 tablet (25 mg total) by mouth every 8 (eight) hours as needed for nausea or vomiting. 30 tablet 0  . traMADol (ULTRAM) 50 MG tablet Take by mouth every 6 (six) hours as needed.     Current Facility-Administered Medications on File Prior to Visit  Medication Dose Route Frequency Provider Last Rate Last Dose  . triamcinolone acetonide (KENALOG) 10 MG/ML injection 10 mg  10 mg Other Once Asencion IslamStover, Latresha Yahr, DPM        Allergies  Allergen Reactions  . Aspirin   . Codeine   . Penicillins     Objective: There were no vitals filed for this visit.  General: No acute distress, AAOx3  Left foot: Sutures intact with no gapping or dehiscence at surgical site, mild swelling to left surgical sites on foot and calf, no erythema, no warmth, no drainage, no signs of infection noted, Capillary fill time <3 seconds in all digits, gross sensation present via light touch to left foot. No pain or crepitation with range of motion left foot however mild  guarding to calf.  No pain with calf compression.   Assessment and Plan:  Problem List Items Addressed This Visit    None    Visit Diagnoses    Plantar fasciitis of left foot    -  Primary   Post-operative state       Tear of tendon of left ankle, subsequent encounter       Tendonitis, Achilles, left           -Patient seen and evaluated -Sutures removed and steristrips applied; continue with ACE wrap and may shower with shower chair  -Continue with crutches and NWB and CAM boot; reordered knee scooter  -Refilled Norco   -Advised patient to limit activity to necessity  -Advised patient to ice and elevate as necessary  -Estimated return to work 12-10-18  -Will plan for xrays and starting passive PT at next office visit. In the meantime, patient to call office if any issues or problems arise.   Asencion Islamitorya Dabney Schanz, DPM

## 2018-10-02 ENCOUNTER — Telehealth: Payer: Self-pay | Admitting: *Deleted

## 2018-10-02 NOTE — Telephone Encounter (Signed)
-----   Message from Asencion Islamitorya Stover, [place] sent at 10/01/2018  8:33 AM EST ----- Regarding: Knee Scooter Patient states that he hasn't got a call about knee scooter

## 2018-10-02 NOTE — Telephone Encounter (Signed)
Unable to leave a message on pt's phone with contact information for [facility], due to restrictions on pt's phone.

## 2018-10-02 NOTE — Telephone Encounter (Signed)
Left message informing Donald Clarke of [facility] [phone] for knee scooter.

## 2018-10-15 ENCOUNTER — Encounter: Payer: Managed Care, Other (non HMO) | Admitting: Sports Medicine

## 2018-10-21 NOTE — Progress Notes (Signed)
DOS 09/09/2018  Release Achilles (gastrocnemieus muscle) and plantar fascia left foot.

## 2018-10-22 ENCOUNTER — Telehealth: Payer: Self-pay | Admitting: *Deleted

## 2018-10-22 ENCOUNTER — Ambulatory Visit (INDEPENDENT_AMBULATORY_CARE_PROVIDER_SITE_OTHER): Payer: Managed Care, Other (non HMO) | Admitting: Sports Medicine

## 2018-10-22 ENCOUNTER — Encounter: Payer: Self-pay | Admitting: Sports Medicine

## 2018-10-22 DIAGNOSIS — S96912D Strain of unspecified muscle and tendon at ankle and foot level, left foot, subsequent encounter: Secondary | ICD-10-CM

## 2018-10-22 DIAGNOSIS — Z9889 Other specified postprocedural states: Secondary | ICD-10-CM

## 2018-10-22 DIAGNOSIS — M722 Plantar fascial fibromatosis: Secondary | ICD-10-CM

## 2018-10-22 DIAGNOSIS — M7662 Achilles tendinitis, left leg: Secondary | ICD-10-CM

## 2018-10-22 NOTE — Telephone Encounter (Signed)
Left message for pt to call with the location of PT he would like in [place].

## 2018-10-22 NOTE — Telephone Encounter (Signed)
-----   Message from [place] Stover, [place] sent at 10/22/2018  9:32 AM EST ----- Regarding: PT PT 2x per week for 4 weeks Strengthening, gait, stability, mobility, pain reduction s/p L foot surgery Patient may come out of CAM boot with PT

## 2018-10-22 NOTE — Progress Notes (Signed)
Subjective: Donald HumphreyBilly Yoak is a 42 y.o. male patient seen today in office for POV # 3 (DOS 09-09-18), S/P L EPF and gastrocnemius recession. Patient admits that pain is doing better, gets ocassional pain but much better than last visit, denies headache, chest pain, shortness of breath, nausea, vomiting, fever, or chills. No other issues noted.   There are no active problems to display for this patient.   Current Outpatient Medications on File Prior to Visit  Medication Sig Dispense Refill  . docusate sodium (COLACE) 100 MG capsule Take 1 capsule (100 mg total) by mouth 2 (two) times daily as needed for mild constipation. 30 capsule 0  . HYDROcodone-acetaminophen (NORCO) 10-325 MG tablet Take 1 tablet by mouth every 8 (eight) hours as needed. 20 tablet 0  . meloxicam (MOBIC) 15 MG tablet TAKE 1 TABLET BY MOUTH EVERY DAY 30 tablet 0  . methylPREDNISolone (MEDROL DOSEPAK) 4 MG TBPK tablet Take as directed 21 tablet 0  . naproxen (NAPROSYN) 500 MG tablet Take 500 mg by mouth 2 (two) times daily with a meal.  0  . predniSONE (DELTASONE) 50 MG tablet     . promethazine (PHENERGAN) 25 MG tablet Take 1 tablet (25 mg total) by mouth every 8 (eight) hours as needed for nausea or vomiting. 30 tablet 0  . traMADol (ULTRAM) 50 MG tablet Take by mouth every 6 (six) hours as needed.     Current Facility-Administered Medications on File Prior to Visit  Medication Dose Route Frequency Provider Last Rate Last Dose  . triamcinolone acetonide (KENALOG) 10 MG/ML injection 10 mg  10 mg Other Once Asencion IslamStover, Guillaume Weninger, DPM        Allergies  Allergen Reactions  . Aspirin   . Codeine   . Penicillins     Objective: There were no vitals filed for this visit.  General: No acute distress, AAOx3  Left foot: Sutures intact with no gapping or dehiscence at surgical site, mild swelling to left surgical sites on foot and calf, no erythema, no warmth, no drainage, no signs of infection noted, Capillary fill time <3 seconds  in all digits, gross sensation present via light touch to left foot. No pain or crepitation with range of motion left foot however mild guarding to calf.  No pain with calf compression.   Assessment and Plan:  Problem List Items Addressed This Visit    None    Visit Diagnoses    Plantar fasciitis of left foot    -  Primary   Tendonitis, Achilles, left       Tear of tendon of left ankle, subsequent encounter       Post-operative state           -Patient seen and evaluated -May start to weightbear with CAM boot -Rx PT may come out of boot for PT -Advised patient to limit activity to necessity  -Advised patient to ice and elevate as necessary  -Estimated return to work 12-10-18; Office staff to f/u [employer] paperwork -Will plan for increasing activities and transition to post op shoe at next office visit. In the meantime, patient to call office if any issues or problems arise.   Asencion Islamitorya Gayathri Futrell, DPM

## 2018-11-12 ENCOUNTER — Encounter: Payer: Self-pay | Admitting: Sports Medicine

## 2018-11-12 ENCOUNTER — Telehealth: Payer: Self-pay | Admitting: *Deleted

## 2018-11-12 ENCOUNTER — Ambulatory Visit (INDEPENDENT_AMBULATORY_CARE_PROVIDER_SITE_OTHER): Payer: Managed Care, Other (non HMO) | Admitting: Sports Medicine

## 2018-11-12 DIAGNOSIS — M722 Plantar fascial fibromatosis: Secondary | ICD-10-CM

## 2018-11-12 DIAGNOSIS — Z9889 Other specified postprocedural states: Secondary | ICD-10-CM

## 2018-11-12 DIAGNOSIS — S96912D Strain of unspecified muscle and tendon at ankle and foot level, left foot, subsequent encounter: Secondary | ICD-10-CM

## 2018-11-12 DIAGNOSIS — M7662 Achilles tendinitis, left leg: Secondary | ICD-10-CM

## 2018-11-12 NOTE — Telephone Encounter (Signed)
[facility]

## 2018-11-12 NOTE — Progress Notes (Signed)
  Subjective: Rizwan Mammone is a 43 y.o. male patient seen today in office for POV # 4 (DOS 09-09-18), S/P L EPF and gastrocnemius recession. Patient admits that pain is doing better that ocassional pain shooting at arch, denies headache, chest pain, shortness of breath, nausea, vomiting, fever, or chills. No other issues noted.   There are no active problems to display for this patient.   Current Outpatient Medications on File Prior to Visit  Medication Sig Dispense Refill  . docusate sodium (COLACE) 100 MG capsule Take 1 capsule (100 mg total) by mouth 2 (two) times daily as needed for mild constipation. 30 capsule 0  . HYDROcodone-acetaminophen (NORCO) 10-325 MG tablet Take 1 tablet by mouth every 8 (eight) hours as needed. 20 tablet 0  . meloxicam (MOBIC) 15 MG tablet TAKE 1 TABLET BY MOUTH EVERY DAY 30 tablet 0  . methylPREDNISolone (MEDROL DOSEPAK) 4 MG TBPK tablet Take as directed 21 tablet 0  . naproxen (NAPROSYN) 500 MG tablet Take 500 mg by mouth 2 (two) times daily with a meal.  0  . predniSONE (DELTASONE) 50 MG tablet     . promethazine (PHENERGAN) 25 MG tablet Take 1 tablet (25 mg total) by mouth every 8 (eight) hours as needed for nausea or vomiting. 30 tablet 0  . traMADol (ULTRAM) 50 MG tablet Take by mouth every 6 (six) hours as needed.     Current Facility-Administered Medications on File Prior to Visit  Medication Dose Route Frequency Provider Last Rate Last Dose  . triamcinolone acetonide (KENALOG) 10 MG/ML injection 10 mg  10 mg Other Once Asencion Islam, DPM        Allergies  Allergen Reactions  . Aspirin   . Codeine   . Penicillins     Objective: There were no vitals filed for this visit.  General: No acute distress, AAOx3  Left foot: Incisions well healed with no gapping or dehiscence at surgical site, mild swelling to left surgical sites on foot and calf, no erythema, no warmth, no drainage, no signs of infection noted, Capillary fill time <3 seconds in all  digits, gross sensation present via light touch to left foot. Subjective ocassional shooting pain. No pain or crepitation with range of motion left foot however mild guarding to calf like before.  No pain with calf compression.   Assessment and Plan:  Problem List Items Addressed This Visit    None    Visit Diagnoses    Plantar fasciitis of left foot    -  Primary   Tendonitis, Achilles, left       Tear of tendon of left ankle, subsequent encounter       Post-operative state           -Patient seen and evaluated -May start to weightbear with Post op shoe as dispensed this visit -Rx PT; Order resent, may come out of shoe for PT -Advised patient to limit activity to tolerance  -Advised patient to ice and elevate as necessary  -Estimated return to work 12-10-18;continue with [employer]  -Will plan for increasing activities and assessing RTW at next office visit. In the meantime, patient to call office if any issues or problems arise.   Asencion Islam, DPM

## 2018-11-12 NOTE — Telephone Encounter (Signed)
-----   Message from Asencion Islam, [place] sent at 11/12/2018  8:58 AM EST ----- Regarding: Call about PT Please make sure patient has PT set up.  Thanks Dr. Kathie Rhodes

## 2018-11-15 ENCOUNTER — Telehealth: Payer: Self-pay | Admitting: Sports Medicine

## 2018-11-15 NOTE — Telephone Encounter (Signed)
Donald Clarke with the [facility] in [place] needs to have Operative Report faxed to [phone].

## 2018-11-18 NOTE — Telephone Encounter (Signed)
Has been faxed.

## 2018-12-03 ENCOUNTER — Ambulatory Visit (INDEPENDENT_AMBULATORY_CARE_PROVIDER_SITE_OTHER): Payer: Managed Care, Other (non HMO) | Admitting: Sports Medicine

## 2018-12-03 ENCOUNTER — Encounter: Payer: Self-pay | Admitting: Sports Medicine

## 2018-12-03 DIAGNOSIS — M7662 Achilles tendinitis, left leg: Secondary | ICD-10-CM

## 2018-12-03 DIAGNOSIS — S96912D Strain of unspecified muscle and tendon at ankle and foot level, left foot, subsequent encounter: Secondary | ICD-10-CM

## 2018-12-03 DIAGNOSIS — Z9889 Other specified postprocedural states: Secondary | ICD-10-CM

## 2018-12-03 DIAGNOSIS — M79672 Pain in left foot: Secondary | ICD-10-CM

## 2018-12-03 DIAGNOSIS — M722 Plantar fascial fibromatosis: Secondary | ICD-10-CM

## 2018-12-03 NOTE — Progress Notes (Signed)
  Subjective: Donald Clarke is a 43 y.o. male patient seen today in office for POV # 5 (DOS 09-09-18), S/P L EPF and gastrocnemius recession. Patient admits that he has a "weird" sensation at heel but NO pain and reports that he can't afford PT and can do exercises at home. Patient denies headache, chest pain, shortness of breath, nausea, vomiting, fever, or chills. No other issues noted.   There are no active problems to display for this patient.   Current Outpatient Medications on File Prior to Visit  Medication Sig Dispense Refill  . docusate sodium (COLACE) 100 MG capsule Take 1 capsule (100 mg total) by mouth 2 (two) times daily as needed for mild constipation. 30 capsule 0  . HYDROcodone-acetaminophen (NORCO) 10-325 MG tablet Take 1 tablet by mouth every 8 (eight) hours as needed. 20 tablet 0  . meloxicam (MOBIC) 15 MG tablet TAKE 1 TABLET BY MOUTH EVERY DAY 30 tablet 0  . methylPREDNISolone (MEDROL DOSEPAK) 4 MG TBPK tablet Take as directed 21 tablet 0  . naproxen (NAPROSYN) 500 MG tablet Take 500 mg by mouth 2 (two) times daily with a meal.  0  . predniSONE (DELTASONE) 50 MG tablet     . promethazine (PHENERGAN) 25 MG tablet Take 1 tablet (25 mg total) by mouth every 8 (eight) hours as needed for nausea or vomiting. 30 tablet 0  . traMADol (ULTRAM) 50 MG tablet Take by mouth every 6 (six) hours as needed.     Current Facility-Administered Medications on File Prior to Visit  Medication Dose Route Frequency Provider Last Rate Last Dose  . triamcinolone acetonide (KENALOG) 10 MG/ML injection 10 mg  10 mg Other Once Asencion Islam, DPM        Allergies  Allergen Reactions  . Aspirin   . Codeine   . Penicillins     Objective: There were no vitals filed for this visit.  General: No acute distress, AAOx3  Left foot: Incisions well healed, no erythema, no warmth, no drainage, no signs of infection noted, Capillary fill time <3 seconds in all digits, gross sensation present via light  touch to left foot. Subjective ocassional numbness to incisions sites. No pain or crepitation with range of motion left foot.  No pain with calf compression.   Assessment and Plan:  Problem List Items Addressed This Visit    None    Visit Diagnoses    Plantar fasciitis of left foot    -  Primary   Tendonitis, Achilles, left       Tear of tendon of left ankle, subsequent encounter       Post-operative state       Pain of left heel           -Patient seen and evaluated -May d/c PT since doing well  -Advised patient to continue with normal shoes  -Estimated return to work 12-10-18 with no limitations  -Will plan for final POV after he has returned back to work at next office visit. In the meantime, patient to call office if any issues or problems arise.   Asencion Islam, DPM

## 2018-12-03 NOTE — Patient Instructions (Signed)
Plantar Fasciitis Rehab Ask your health care provider which exercises are safe for you. Do exercises exactly as told by your health care provider and adjust them as directed. It is normal to feel mild stretching, pulling, tightness, or discomfort as you do these exercises, but you should stop right away if you feel sudden pain or your pain gets worse. Do not begin these exercises until told by your health care provider. Stretching and range of motion exercises These exercises warm up your muscles and joints and improve the movement and flexibility of your foot. These exercises also help to relieve pain. Exercise A: Plantar fascia stretch  1. Sit with your left / right leg crossed over your opposite knee. 2. Hold your heel with one hand with that thumb near your arch. With your other hand, hold your toes and gently pull them back toward the top of your foot. You should feel a stretch on the bottom of your toes or your foot or both. 3. Hold this stretch for__________ seconds. 4. Slowly release your toes and return to the starting position. Repeat __________ times. Complete this exercise __________ times a day. Exercise B: Gastroc, standing  1. Stand with your hands against a wall. 2. Extend your left / right leg behind you, and bend your front knee slightly. 3. Keeping your heels on the floor and keeping your back knee straight, shift your weight toward the wall without arching your back. You should feel a gentle stretch in your left / right calf. 4. Hold this position for __________ seconds. Repeat __________ times. Complete this exercise __________ times a day. Exercise C: Soleus, standing 1. Stand with your hands against a wall. 2. Extend your left / right leg behind you, and bend your front knee slightly. 3. Keeping your heels on the floor, bend your back knee and slightly shift your weight over the back leg. You should feel a gentle stretch deep in your calf. 4. Hold this position for  __________ seconds. Repeat __________ times. Complete this exercise __________ times a day. Exercise D: Gastrocsoleus, standing 1. Stand with the ball of your left / right foot on a step. The ball of your foot is on the walking surface, right under your toes. 2. Keep your other foot firmly on the same step. 3. Hold onto the wall or a railing for balance. 4. Slowly lift your other foot, allowing your body weight to press your heel down over the edge of the step. You should feel a stretch in your left / right calf. 5. Hold this position for __________ seconds. 6. Return both feet to the step. 7. Repeat this exercise with a slight bend in your left / right knee. Repeat __________ times with your left / right knee straight and __________ times with your left / right knee bent. Complete this exercise __________ times a day. Balance exercise This exercise builds your balance and strength control of your arch to help take pressure off your plantar fascia. Exercise E: Single leg stand 1. Without shoes, stand near a railing or in a doorway. You may hold onto the railing or door frame as needed. 2. Stand on your left / right foot. Keep your big toe down on the floor and try to keep your arch lifted. Do not let your foot roll inward. 3. Hold this position for __________ seconds. 4. If this exercise is too easy, you can try it with your eyes closed or while standing on a pillow. Repeat __________ times. Complete  this exercise __________ times a day. This information is not intended to replace advice given to you by your health care provider. Make sure you discuss any questions you have with your health care provider. Document Released: 10/23/2005 Document Revised: 06/27/2016 Document Reviewed: 09/06/2015 [employer] Interactive Patient Education  2019 [employer] Achilles Tendinitis Rehab Ask your health care provider which exercises are safe for you. Do exercises exactly as told by your health care  provider and adjust them as directed. It is normal to feel mild stretching, pulling, tightness, or discomfort as you do these exercises, but you should stop right away if you feel sudden pain or your pain gets worse. Do not begin these exercises until told by your health care provider. Stretching and range of motion exercises These exercises warm up your muscles and joints and improve the movement and flexibility of your ankle. These exercises also help to relieve pain, numbness, and tingling. Exercise A: Standing wall calf stretch, knee straight  1. Stand with your hands against a wall. 2. Extend your __________ leg behind you and bend your front knee slightly. Keep both of your heels on the floor. 3. Point the toes of your back foot slightly inward. 4. Keeping your heels on the floor and your back knee straight, shift your weight toward the wall. Do not allow your back to arch. You should feel a gentle stretch in your calf. 5. Hold this position for seconds. Repeat __________ times. Complete this stretch __________ times per day. Exercise B: Standing wall calf stretch, knee bent 1. Stand with your hands against a wall. 2. Extend your __________ leg behind you, and bend your front knee slightly. Keep both of your heels on the floor. 3. Point the toes of your back foot slightly inward. 4. Keeping your heels on the floor, unlock your back knee so that it is bent. You should feel a gentle stretch deep in your calf. 5. Hold this position for __________ seconds. Repeat __________ times. Complete this stretch __________ times per day. Strengthening exercises These exercises build strength and control of your ankle. Endurance is the ability to use your muscles for a long time, even after they get tired. Exercise C: Plantar flexion with band  1. Sit on the floor with your __________ leg extended. You may put a pillow under your calf to give your foot more room to move. 2. Loop a rubber exercise band  or tube around the ball of your __________ foot. The ball of your foot is on the walking surface, right under your toes. The band or tube should be slightly tense when your foot is relaxed. If the band or tube slips, you can put on your shoe or put a washcloth between the band and your foot to help it stay in place. 3. Slowly point your toes downward, pushing them away from you. 4. Hold this position for __________ seconds. 5. Slowly release the tension in the band or tube, controlling smoothly until your foot is back to the starting position. Repeat __________ times. Complete this exercise __________ times per day. Exercise D: Heel raise with eccentric lower  1. Stand on a step with the balls of your feet. The ball of your foot is on the walking surface, right under your toes. ? Do not put your heels on the step. ? For balance, rest your hands on the wall or on a railing. 2. Rise up onto the balls of your feet. 3. Keeping your heels up, shift all of  your weight to your __________ leg and pick up your other leg. 4. Slowly lower your __________ leg so your heel drops below the level of the step. 5. Put down your foot. If told by your health care provider, build up to:  3 sets of 15 repetitions while keeping your knees straight.  3 sets of 15 repetitions while keeping your knees bent as far as told by your health care provider. Complete this exercise __________ times per day. If this exercise is too easy, try doing it while wearing a backpack with weights in it. Balance exercises These exercises improve or maintain your balance. Balance is important in preventing falls. Exercise E: Single leg stand 1. Without shoes, stand near a railing or in a door frame. Hold on to the railing or door frame as needed. 2. Stand on your __________ foot. Keep your big toe down on the floor and try to keep your arch lifted. 3. Hold this position for __________ seconds. Repeat __________ times. Complete this  exercise __________ times per day. If this exercise is too easy, you can try it with your eyes closed or while standing on a pillow. This information is not intended to replace advice given to you by your health care provider. Make sure you discuss any questions you have with your health care provider. Document Released: 05/24/2005 Document Revised: 04/09/2017 Document Reviewed: 06/29/2015 Elsevier Interactive Patient Education  2019 [employer].

## 2019-01-04 IMAGING — US US EXTREM LOW*L* COMPLETE
1 series · 14 of 23 positions shown · non-contrast
Comparison: Radiographs dated 07/22/2018

CLINICAL DATA: Plantar left foot pain.  Posterior left heel pain.

EXAM:
LEFT LOWER EXTREMITY SOFT TISSUE ULTRASOUND COMPLETE
TECHNIQUE: Ultrasound examination was performed including evaluation of the
muscles, tendons, joint, and adjacent soft tissues.

[Series 1: us extrem low*left* complete · 0.08mm/px · 23 acquisitions, 14 frames shown]
[im 1/23]
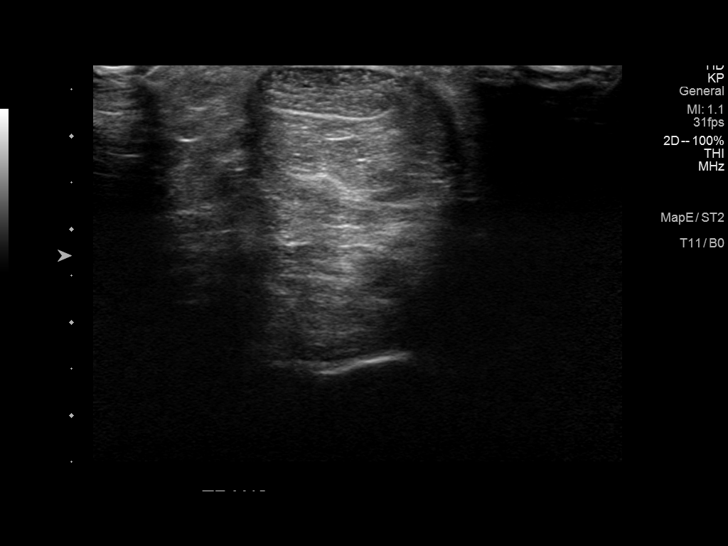
[im 3/23]
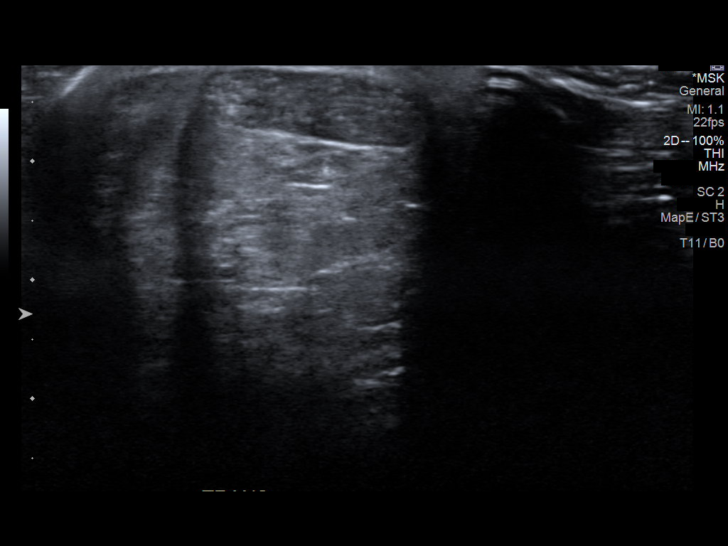
[im 5/23]
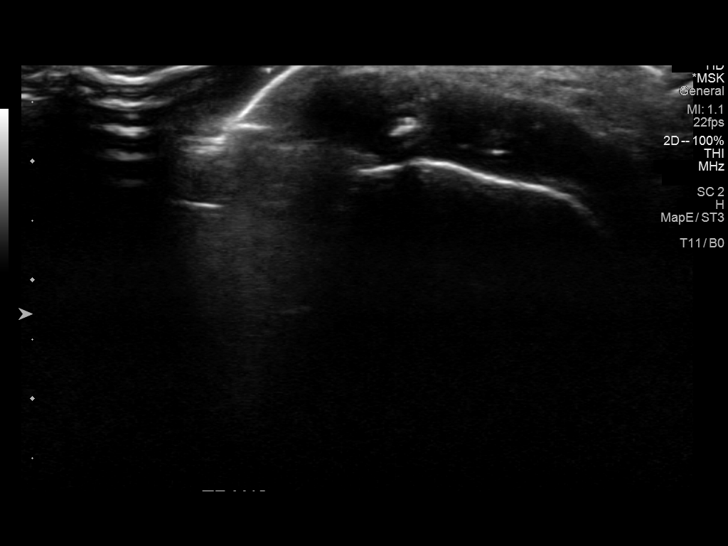
[im 6/23]
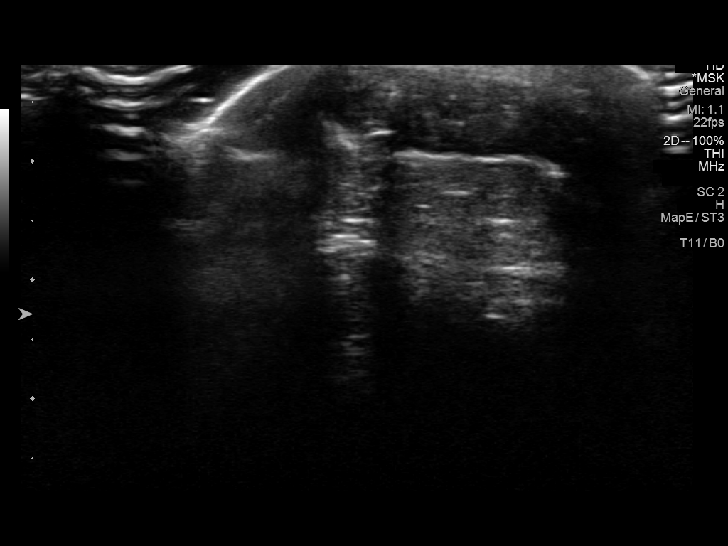
[im 8/23]
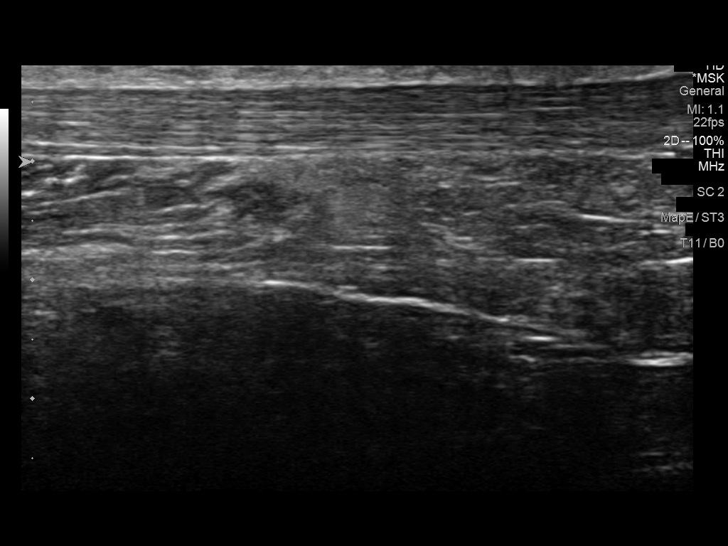
[im 10/23]
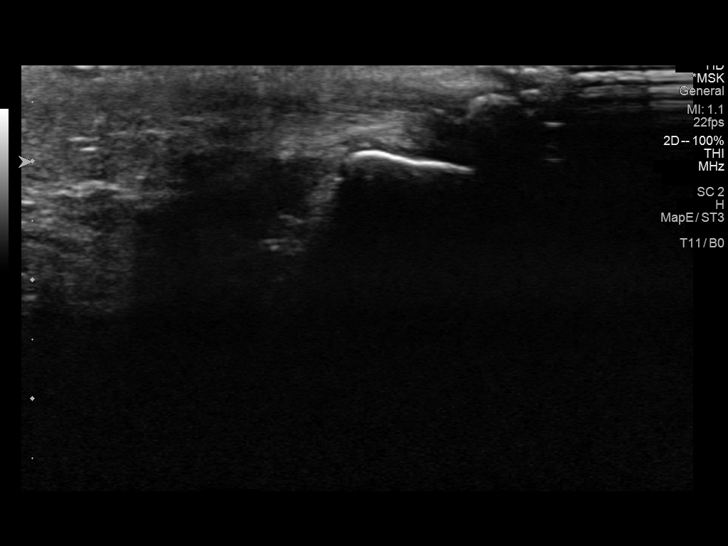
[im 11/23]
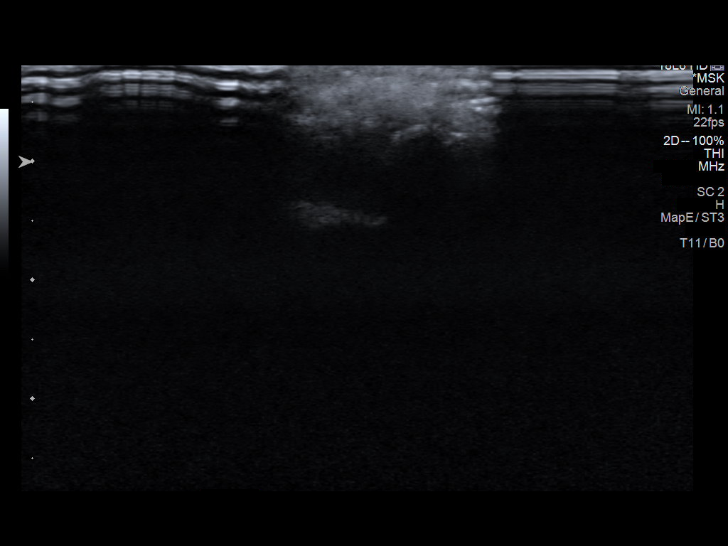
[im 13/23]
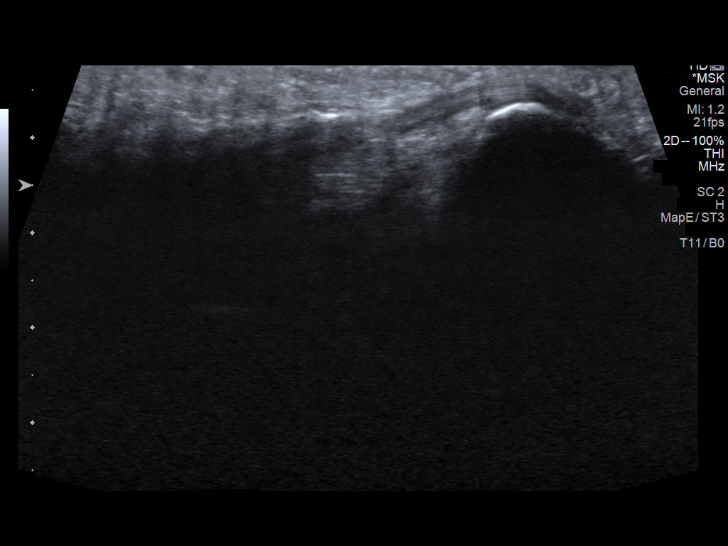
[im 14/23]
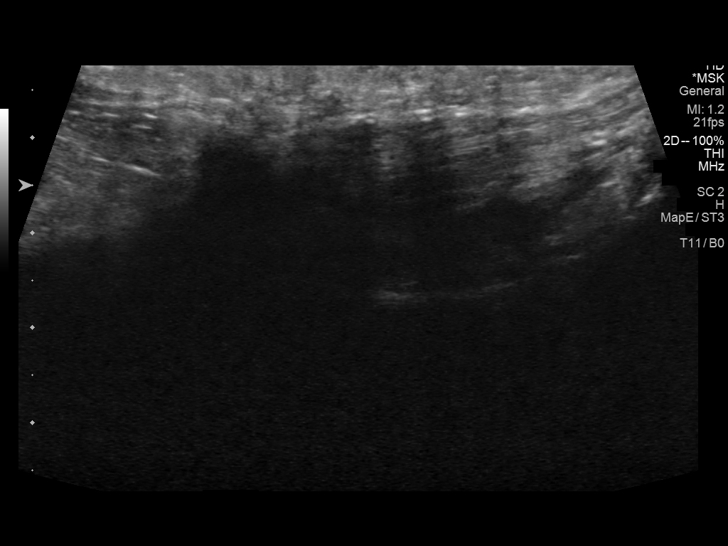
[im 16/23]
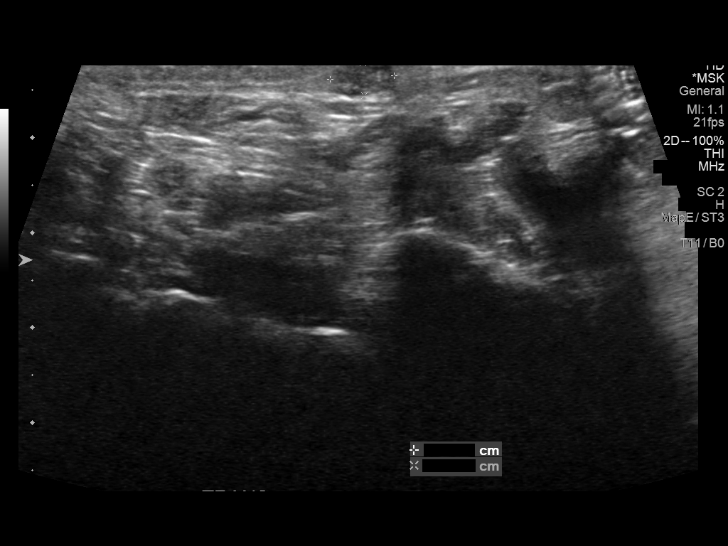
[im 18/23]
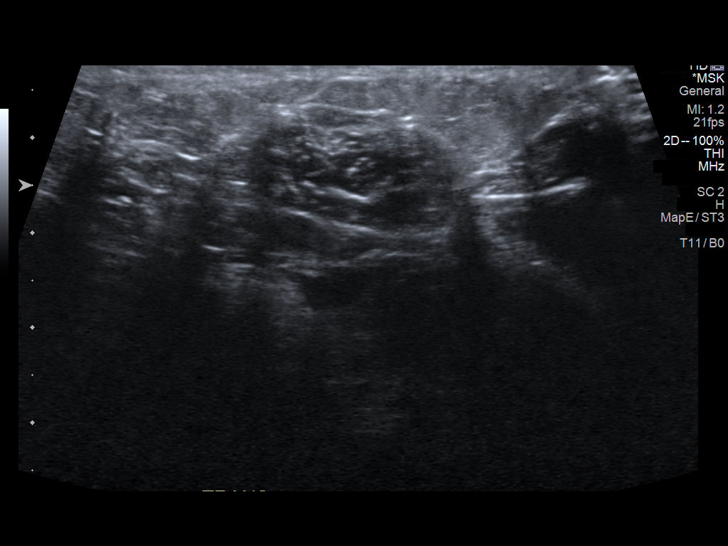
[im 19/23]
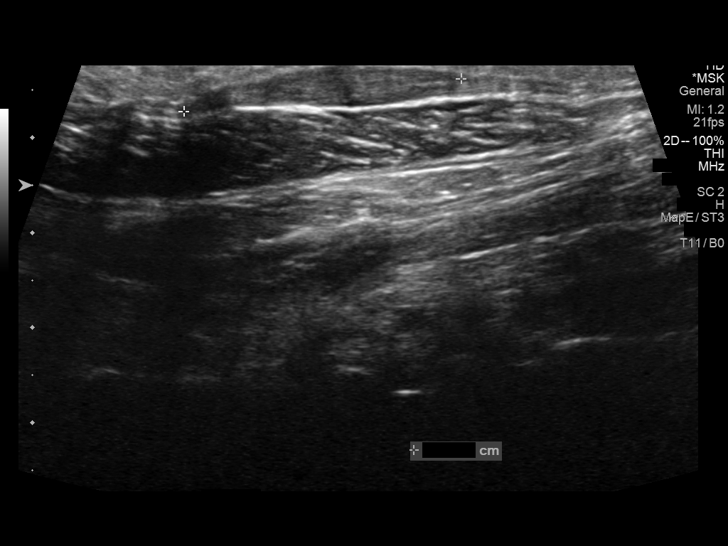
[im 21/23]
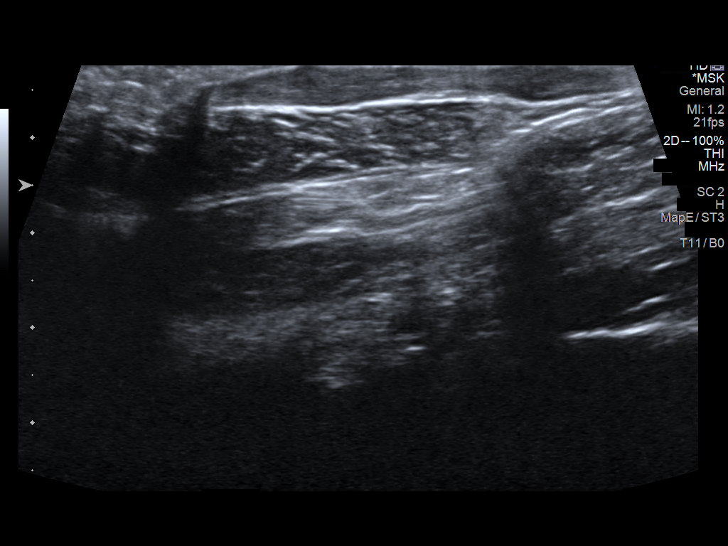
[im 23/23]
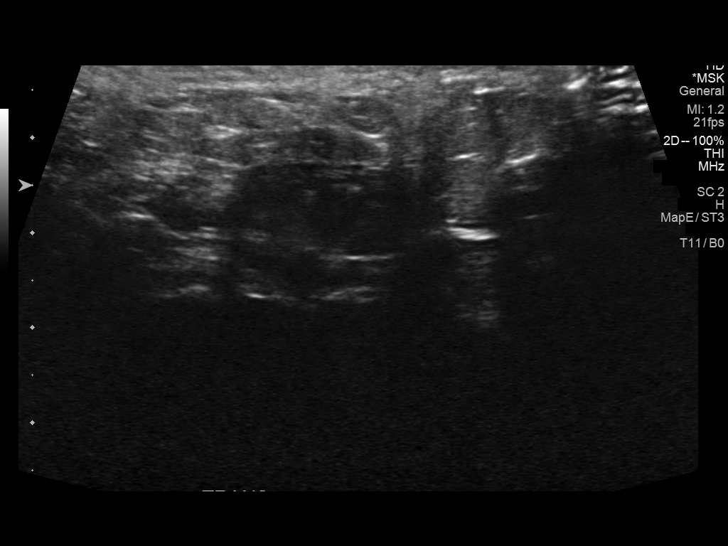

[14 of 23 positions shown; findings below may reference images not displayed]

FINDINGS: Tendons: There is chronic calcific tendinopathy of the distal
Achilles tendon. There is only minimal hypertrophy of the distal
Achilles tendon without a discrete tear.

Other Soft Tissue Structures: The patient has multifocal plantar
fibromatosis with at least 3 focal areas of hypertrophy of the
medial band of the plantar fascia. There is an area of hypertrophy
of the medial band of the plantar fascia measuring 4.5 cm in length
with a maximum width of approximately 2 cm. There are 2 smaller
proximal and distal nodules on the plantar fascia.

No other abnormality.
IMPRESSION: 1. Multifocal plantar fibromatosis.
2. Chronic calcific tendinopathy of the distal Achilles tendon
without a tear the

## 2019-01-14 ENCOUNTER — Ambulatory Visit: Payer: Managed Care, Other (non HMO) | Admitting: Sports Medicine

## 2019-09-25 ENCOUNTER — Encounter: Payer: Self-pay | Admitting: Emergency Medicine
